# Patient Record
Sex: Male | Born: 1995 | Race: Black or African American | Hispanic: No | Marital: Single | Smoking: Never smoker
Health system: Southern US, Community
[De-identification: ages and names within clinical notes are randomized; demographics above are authoritative.]

---

## 2015-06-05 ENCOUNTER — Encounter: Payer: Self-pay | Admitting: Emergency Medicine

## 2015-06-05 ENCOUNTER — Emergency Department
Admission: EM | Admit: 2015-06-05 | Discharge: 2015-06-05 | Discharge status: Against Medical Advice | Payer: No Typology Code available for payment source | Attending: Emergency Medicine | Admitting: Emergency Medicine

## 2015-06-05 DIAGNOSIS — S3992XA Unspecified injury of lower back, initial encounter: Secondary | ICD-10-CM | POA: Diagnosis present

## 2015-06-05 DIAGNOSIS — Y9289 Other specified places as the place of occurrence of the external cause: Secondary | ICD-10-CM | POA: Diagnosis not present

## 2015-06-05 DIAGNOSIS — Y9389 Activity, other specified: Secondary | ICD-10-CM | POA: Insufficient documentation

## 2015-06-05 DIAGNOSIS — Y998 Other external cause status: Secondary | ICD-10-CM | POA: Insufficient documentation

## 2015-06-05 NOTE — ED Notes (Signed)
Not in room for eval . Gown in chair
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
## 2015-11-01 ENCOUNTER — Emergency Department: Payer: 59

## 2015-11-01 ENCOUNTER — Emergency Department
Admission: EM | Admit: 2015-11-01 | Discharge: 2015-11-01 | Disposition: A | Discharge status: Home / Self Care | Payer: 59 | Attending: Emergency Medicine | Admitting: Emergency Medicine

## 2015-11-01 DIAGNOSIS — S4992XA Unspecified injury of left shoulder and upper arm, initial encounter: Secondary | ICD-10-CM | POA: Insufficient documentation

## 2015-11-01 DIAGNOSIS — S80211A Abrasion, right knee, initial encounter: Secondary | ICD-10-CM | POA: Insufficient documentation

## 2015-11-01 DIAGNOSIS — Y998 Other external cause status: Secondary | ICD-10-CM | POA: Diagnosis not present

## 2015-11-01 DIAGNOSIS — S060X9A Concussion with loss of consciousness of unspecified duration, initial encounter: Secondary | ICD-10-CM | POA: Diagnosis not present

## 2015-11-01 DIAGNOSIS — Y9389 Activity, other specified: Secondary | ICD-10-CM | POA: Insufficient documentation

## 2015-11-01 DIAGNOSIS — Y92410 Unspecified street and highway as the place of occurrence of the external cause: Secondary | ICD-10-CM | POA: Diagnosis not present

## 2015-11-01 DIAGNOSIS — S4991XA Unspecified injury of right shoulder and upper arm, initial encounter: Secondary | ICD-10-CM | POA: Diagnosis not present

## 2015-11-01 DIAGNOSIS — S50311A Abrasion of right elbow, initial encounter: Secondary | ICD-10-CM | POA: Insufficient documentation

## 2015-11-01 DIAGNOSIS — T07XXXA Unspecified multiple injuries, initial encounter: Secondary | ICD-10-CM

## 2015-11-01 DIAGNOSIS — S80212A Abrasion, left knee, initial encounter: Secondary | ICD-10-CM | POA: Insufficient documentation

## 2015-11-01 DIAGNOSIS — S0990XA Unspecified injury of head, initial encounter: Secondary | ICD-10-CM | POA: Diagnosis present

## 2015-11-01 MED ORDER — BACITRACIN ZINC 500 UNIT/GM EX OINT
TOPICAL_OINTMENT | CUTANEOUS | Status: AC
  Administered 2015-11-01: 1
  Filled 2015-11-01: qty 0.9

## 2015-11-01 NOTE — ED Notes (Signed)
Pt called ride. Ride is on the way.

## 2015-11-01 NOTE — ED Notes (Signed)
Pt is sleepy. Pt denies any chest pain, abdominal pain, or SOB.
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
## 2015-11-01 NOTE — Discharge Instructions (Signed)
Concussion, Adult  A concussion, or closed-head injury, is a brain injury caused by a direct blow to the head or by a quick and sudden movement (jolt) of the head or neck. Concussions are usually not life-threatening. Even so, the effects of a concussion can be serious. If you have had a concussion before, you are more likely to experience concussion-like symptoms after a direct blow to the head.   CAUSES  · Direct blow to the head, such as from running into another player during a soccer game, being hit in a fight, or hitting your head on a hard surface.  · A jolt of the head or neck that causes the brain to move back and forth inside the skull, such as in a car crash.  SIGNS AND SYMPTOMS  The signs of a concussion can be hard to notice. Early on, they may be missed by you, family members, and health care providers. You may look fine but act or feel differently.  Symptoms are usually temporary, but they may last for days, weeks, or even longer. Some symptoms may appear right away while others may not show up for hours or days. Every head injury is different. Symptoms include:  · Mild to moderate headaches that will not go away.  · A feeling of pressure inside your head.  · Having more trouble than usual:    Learning or remembering things you have heard.    Answering questions.    Paying attention or concentrating.    Organizing daily tasks.    Making decisions and solving problems.  · Slowness in thinking, acting or reacting, speaking, or reading.  · Getting lost or being easily confused.  · Feeling tired all the time or lacking energy (fatigued).  · Feeling drowsy.  · Sleep disturbances.    Sleeping more than usual.    Sleeping less than usual.    Trouble falling asleep.    Trouble sleeping (insomnia).  · Loss of balance or feeling lightheaded or dizzy.  · Nausea or vomiting.  · Numbness or tingling.  · Increased sensitivity to:    Sounds.    Lights.    Distractions.  · Vision problems or eyes that tire  easily.  · Diminished sense of taste or smell.  · Ringing in the ears.  · Mood changes such as feeling sad or anxious.  · Becoming easily irritated or angry for little or no reason.  · Lack of motivation.  · Seeing or hearing things other people do not see or hear (hallucinations).  DIAGNOSIS  Your health care provider can usually diagnose a concussion based on a description of your injury and symptoms. He or she will ask whether you passed out (lost consciousness) and whether you are having trouble remembering events that happened right before and during your injury.  Your evaluation might include:  · A brain scan to look for signs of injury to the brain. Even if the test shows no injury, you may still have a concussion.  · Blood tests to be sure other problems are not present.  TREATMENT  · Concussions are usually treated in an emergency department, in urgent care, or at a clinic. You may need to stay in the hospital overnight for further treatment.  · Tell your health care provider if you are taking any medicines, including prescription medicines, over-the-counter medicines, and natural remedies. Some medicines, such as blood thinners (anticoagulants) and aspirin, may increase the chance of complications. Also tell your health care   provider whether you have had alcohol or are taking illegal drugs. This information may affect treatment.  · Your health care provider will send you home with important instructions to follow.  · How fast you will recover from a concussion depends on many factors. These factors include how severe your concussion is, what part of your brain was injured, your age, and how healthy you were before the concussion.  · Most people with mild injuries recover fully. Recovery can take time. In general, recovery is slower in older persons. Also, persons who have had a concussion in the past or have other medical problems may find that it takes longer to recover from their current injury.  HOME  CARE INSTRUCTIONS  General Instructions  · Carefully follow the directions your health care provider gave you.  · Only take over-the-counter or prescription medicines for pain, discomfort, or fever as directed by your health care provider.  · Take only those medicines that your health care provider has approved.  · Do not drink alcohol until your health care provider says you are well enough to do so. Alcohol and certain other drugs may slow your recovery and can put you at risk of further injury.  · If it is harder than usual to remember things, write them down.  · If you are easily distracted, try to do one thing at a time. For example, do not try to watch TV while fixing dinner.  · Talk with family members or close friends when making important decisions.  · Keep all follow-up appointments. Repeated evaluation of your symptoms is recommended for your recovery.  · Watch your symptoms and tell others to do the same. Complications sometimes occur after a concussion. Older adults with a brain injury may have a higher risk of serious complications, such as a blood clot on the brain.  · Tell your teachers, school nurse, school counselor, coach, athletic trainer, or work manager about your injury, symptoms, and restrictions. Tell them about what you can or cannot do. They should watch for:    Increased problems with attention or concentration.    Increased difficulty remembering or learning new information.    Increased time needed to complete tasks or assignments.    Increased irritability or decreased ability to cope with stress.    Increased symptoms.  · Rest. Rest helps the brain to heal. Make sure you:    Get plenty of sleep at night. Avoid staying up late at night.    Keep the same bedtime hours on weekends and weekdays.    Rest during the day. Take daytime naps or rest breaks when you feel tired.  · Limit activities that require a lot of thought or concentration. These include:    Doing homework or job-related  work.    Watching TV.    Working on the computer.  · Avoid any situation where there is potential for another head injury (football, hockey, soccer, basketball, martial arts, downhill snow sports and horseback riding). Your condition will get worse every time you experience a concussion. You should avoid these activities until you are evaluated by the appropriate follow-up health care providers.  Returning To Your Regular Activities  You will need to return to your normal activities slowly, not all at once. You must give your body and brain enough time for recovery.  · Do not return to sports or other athletic activities until your health care provider tells you it is safe to do so.  · Ask   your health care provider when you can drive, ride a bicycle, or operate heavy machinery. Your ability to react may be slower after a brain injury. Never do these activities if you are dizzy.  · Ask your health care provider about when you can return to work or school.  Preventing Another Concussion  It is very important to avoid another brain injury, especially before you have recovered. In rare cases, another injury can lead to permanent brain damage, brain swelling, or death. The risk of this is greatest during the first 7-10 days after a head injury. Avoid injuries by:  · Wearing a seat belt when riding in a car.  · Drinking alcohol only in moderation.  · Wearing a helmet when biking, skiing, skateboarding, skating, or doing similar activities.  · Avoiding activities that could lead to a second concussion, such as contact or recreational sports, until your health care provider says it is okay.  · Taking safety measures in your home.    Remove clutter and tripping hazards from floors and stairways.    Use grab bars in bathrooms and handrails by stairs.    Place non-slip mats on floors and in bathtubs.    Improve lighting in dim areas.  SEEK MEDICAL CARE IF:  · You have increased problems paying attention or  concentrating.  · You have increased difficulty remembering or learning new information.  · You need more time to complete tasks or assignments than before.  · You have increased irritability or decreased ability to cope with stress.  · You have more symptoms than before.  Seek medical care if you have any of the following symptoms for more than 2 weeks after your injury:  · Lasting (chronic) headaches.  · Dizziness or balance problems.  · Nausea.  · Vision problems.  · Increased sensitivity to noise or light.  · Depression or mood swings.  · Anxiety or irritability.  · Memory problems.  · Difficulty concentrating or paying attention.  · Sleep problems.  · Feeling tired all the time.  SEEK IMMEDIATE MEDICAL CARE IF:  · You have severe or worsening headaches. These may be a sign of a blood clot in the brain.  · You have weakness (even if only in one hand, leg, or part of the face).  · You have numbness.  · You have decreased coordination.  · You vomit repeatedly.  · You have increased sleepiness.  · One pupil is larger than the other.  · You have convulsions.  · You have slurred speech.  · You have increased confusion. This may be a sign of a blood clot in the brain.  · You have increased restlessness, agitation, or irritability.  · You are unable to recognize people or places.  · You have neck pain.  · It is difficult to wake you up.  · You have unusual behavior changes.  · You lose consciousness.  MAKE SURE YOU:  · Understand these instructions.  · Will watch your condition.  · Will get help right away if you are not doing well or get worse.     This information is not intended to replace advice given to you by your health care provider. Make sure you discuss any questions you have with your health care provider.     Document Released: 01/18/2004 Document Revised: 11/18/2014 Document Reviewed: 05/20/2013  Elsevier Interactive Patient Education ©2016 [employer]

## 2015-11-28 ENCOUNTER — Encounter: Payer: Self-pay | Admitting: Emergency Medicine

## 2015-11-28 ENCOUNTER — Emergency Department
Admission: EM | Admit: 2015-11-28 | Discharge: 2015-11-28 | Disposition: A | Discharge status: Home / Self Care | Payer: No Typology Code available for payment source | Attending: Emergency Medicine | Admitting: Emergency Medicine

## 2015-11-28 ENCOUNTER — Emergency Department: Payer: No Typology Code available for payment source

## 2015-11-28 DIAGNOSIS — Y998 Other external cause status: Secondary | ICD-10-CM | POA: Diagnosis not present

## 2015-11-28 DIAGNOSIS — M542 Cervicalgia: Secondary | ICD-10-CM | POA: Diagnosis not present

## 2015-11-28 DIAGNOSIS — Z743 Need for continuous supervision: Secondary | ICD-10-CM | POA: Diagnosis not present

## 2015-11-28 DIAGNOSIS — S299XXA Unspecified injury of thorax, initial encounter: Secondary | ICD-10-CM | POA: Diagnosis not present

## 2015-11-28 DIAGNOSIS — Y92410 Unspecified street and highway as the place of occurrence of the external cause: Secondary | ICD-10-CM | POA: Insufficient documentation

## 2015-11-28 DIAGNOSIS — S161XXA Strain of muscle, fascia and tendon at neck level, initial encounter: Secondary | ICD-10-CM | POA: Diagnosis not present

## 2015-11-28 DIAGNOSIS — S199XXA Unspecified injury of neck, initial encounter: Secondary | ICD-10-CM | POA: Diagnosis present

## 2015-11-28 DIAGNOSIS — Y9384 Activity, sleeping: Secondary | ICD-10-CM | POA: Insufficient documentation

## 2015-11-28 DIAGNOSIS — M545 Low back pain: Secondary | ICD-10-CM | POA: Diagnosis not present

## 2015-11-28 MED ORDER — IBUPROFEN 800 MG PO TABS: 800.0000 mg | ORAL_TABLET | Freq: Three times a day (TID) | ORAL | Status: DC | PRN

## 2015-11-28 MED ORDER — METHOCARBAMOL 750 MG PO TABS: 1500.0000 mg | ORAL_TABLET | Freq: Four times a day (QID) | ORAL | Status: DC

## 2015-11-28 MED ORDER — TRAMADOL HCL 50 MG PO TABS: 50.0000 mg | ORAL_TABLET | Freq: Four times a day (QID) | ORAL | Status: DC | PRN
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
## 2015-11-28 NOTE — Discharge Instructions (Signed)

## 2016-01-09 DIAGNOSIS — R05 Cough: Secondary | ICD-10-CM | POA: Diagnosis not present

## 2016-01-09 DIAGNOSIS — J101 Influenza due to other identified influenza virus with other respiratory manifestations: Secondary | ICD-10-CM | POA: Diagnosis not present

## 2016-08-11 DIAGNOSIS — N342 Other urethritis: Secondary | ICD-10-CM | POA: Diagnosis not present

## 2016-08-11 DIAGNOSIS — R300 Dysuria: Secondary | ICD-10-CM | POA: Diagnosis not present

## 2016-10-18 ENCOUNTER — Emergency Department (HOSPITAL_COMMUNITY)
Admission: EM | Admit: 2016-10-18 | Discharge: 2016-10-19 | Disposition: A | Discharge status: Home / Self Care | Payer: 59 | Attending: Emergency Medicine | Admitting: Emergency Medicine

## 2016-10-18 ENCOUNTER — Encounter (HOSPITAL_COMMUNITY): Payer: Self-pay | Admitting: Emergency Medicine

## 2016-10-18 ENCOUNTER — Emergency Department (HOSPITAL_COMMUNITY): Payer: 59

## 2016-10-18 DIAGNOSIS — S63642A Sprain of metacarpophalangeal joint of left thumb, initial encounter: Secondary | ICD-10-CM | POA: Insufficient documentation

## 2016-10-18 DIAGNOSIS — S199XXA Unspecified injury of neck, initial encounter: Secondary | ICD-10-CM | POA: Diagnosis not present

## 2016-10-18 DIAGNOSIS — M79645 Pain in left finger(s): Secondary | ICD-10-CM | POA: Diagnosis not present

## 2016-10-18 DIAGNOSIS — Y999 Unspecified external cause status: Secondary | ICD-10-CM | POA: Diagnosis not present

## 2016-10-18 DIAGNOSIS — Y92410 Unspecified street and highway as the place of occurrence of the external cause: Secondary | ICD-10-CM | POA: Diagnosis not present

## 2016-10-18 DIAGNOSIS — S6992XA Unspecified injury of left wrist, hand and finger(s), initial encounter: Secondary | ICD-10-CM | POA: Diagnosis not present

## 2016-10-18 DIAGNOSIS — T148XXA Other injury of unspecified body region, initial encounter: Secondary | ICD-10-CM | POA: Diagnosis not present

## 2016-10-18 DIAGNOSIS — M542 Cervicalgia: Secondary | ICD-10-CM | POA: Diagnosis not present

## 2016-10-18 DIAGNOSIS — S161XXA Strain of muscle, fascia and tendon at neck level, initial encounter: Secondary | ICD-10-CM | POA: Diagnosis not present

## 2016-10-18 DIAGNOSIS — Y9389 Activity, other specified: Secondary | ICD-10-CM | POA: Insufficient documentation
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
## 2016-10-19 DIAGNOSIS — S63642A Sprain of metacarpophalangeal joint of left thumb, initial encounter: Secondary | ICD-10-CM | POA: Diagnosis not present

## 2016-10-19 DIAGNOSIS — S161XXA Strain of muscle, fascia and tendon at neck level, initial encounter: Secondary | ICD-10-CM | POA: Diagnosis not present

## 2016-10-19 MED ORDER — IBUPROFEN 800 MG PO TABS: 800.0000 mg | ORAL_TABLET | Freq: Three times a day (TID) | ORAL | 0 refills | Status: DC

## 2016-10-19 MED ORDER — TRAMADOL HCL 50 MG PO TABS: 50.0000 mg | ORAL_TABLET | Freq: Four times a day (QID) | ORAL | 0 refills | Status: DC | PRN

## 2016-10-19 NOTE — Progress Notes (Signed)
Orthopedic Tech Progress Note Patient Details:  Mason Wright [birth year] [id number]  Ortho Devices Type of Ortho Device: Thumb velcro splint Ortho Device/Splint Location:  lue Ortho Device/Splint Interventions: Ordered, Application   Trinna PostMartinez, [name] 10/19/2016, 1:56 AM

## 2016-11-14 DIAGNOSIS — M25511 Pain in right shoulder: Secondary | ICD-10-CM | POA: Diagnosis not present

## 2016-11-14 DIAGNOSIS — S4991XA Unspecified injury of right shoulder and upper arm, initial encounter: Secondary | ICD-10-CM | POA: Diagnosis not present

## 2016-12-29 DIAGNOSIS — B081 Molluscum contagiosum: Secondary | ICD-10-CM | POA: Diagnosis not present

## 2017-03-14 DIAGNOSIS — Z114 Encounter for screening for human immunodeficiency virus [HIV]: Secondary | ICD-10-CM | POA: Diagnosis not present

## 2017-03-14 DIAGNOSIS — R21 Rash and other nonspecific skin eruption: Secondary | ICD-10-CM | POA: Diagnosis not present

## 2017-11-02 DIAGNOSIS — R05 Cough: Secondary | ICD-10-CM | POA: Diagnosis not present

## 2020-06-06 DIAGNOSIS — H938X2 Other specified disorders of left ear: Secondary | ICD-10-CM | POA: Diagnosis not present

## 2020-06-06 DIAGNOSIS — H6121 Impacted cerumen, right ear: Secondary | ICD-10-CM | POA: Diagnosis not present

## 2020-06-06 DIAGNOSIS — H6122 Impacted cerumen, left ear: Secondary | ICD-10-CM | POA: Diagnosis not present

## 2021-02-14 ENCOUNTER — Emergency Department (HOSPITAL_COMMUNITY)
Admission: EM | Admit: 2021-02-14 | Discharge: 2021-02-14 | Disposition: A | Discharge status: Home / Self Care | Payer: 59 | Attending: Emergency Medicine | Admitting: Emergency Medicine

## 2021-02-14 ENCOUNTER — Other Ambulatory Visit: Payer: Self-pay

## 2021-02-14 ENCOUNTER — Encounter (HOSPITAL_COMMUNITY): Payer: Self-pay

## 2021-02-14 DIAGNOSIS — R197 Diarrhea, unspecified: Secondary | ICD-10-CM | POA: Diagnosis not present

## 2021-02-14 DIAGNOSIS — R112 Nausea with vomiting, unspecified: Secondary | ICD-10-CM | POA: Insufficient documentation

## 2021-02-14 DIAGNOSIS — R109 Unspecified abdominal pain: Secondary | ICD-10-CM | POA: Diagnosis not present

## 2021-02-14 LAB — CBC WITH DIFFERENTIAL/PLATELET
Abs Immature Granulocytes: 0.09 10*3/uL — ABNORMAL HIGH (ref 0.00–0.07)
Basophils Absolute: 0 10*3/uL (ref 0.0–0.1)
Basophils Relative: 0 %
Eosinophils Absolute: 0 10*3/uL (ref 0.0–0.5)
Eosinophils Relative: 0 %
HCT: 53.3 % — ABNORMAL HIGH (ref 39.0–52.0)
Hemoglobin: 17.1 g/dL — ABNORMAL HIGH (ref 13.0–17.0)
Immature Granulocytes: 1 %
Lymphocytes Relative: 3 %
Lymphs Abs: 0.3 10*3/uL — ABNORMAL LOW (ref 0.7–4.0)
MCH: 26.8 pg (ref 26.0–34.0)
MCHC: 32.1 g/dL (ref 30.0–36.0)
MCV: 83.7 fL (ref 80.0–100.0)
Monocytes Absolute: 0.6 10*3/uL (ref 0.1–1.0)
Monocytes Relative: 5 %
Neutro Abs: 11 10*3/uL — ABNORMAL HIGH (ref 1.7–7.7)
Neutrophils Relative %: 91 %
Platelets: 250 10*3/uL (ref 150–400)
RBC: 6.37 MIL/uL — ABNORMAL HIGH (ref 4.22–5.81)
RDW: 12.9 % (ref 11.5–15.5)
WBC: 12.1 10*3/uL — ABNORMAL HIGH (ref 4.0–10.5)
nRBC: 0 % (ref 0.0–0.2)

## 2021-02-14 LAB — COMPREHENSIVE METABOLIC PANEL
ALT: 16 U/L (ref 0–44)
AST: 28 U/L (ref 15–41)
Albumin: 3.9 g/dL (ref 3.5–5.0)
Alkaline Phosphatase: 43 U/L (ref 38–126)
Anion gap: 8 (ref 5–15)
BUN: 18 mg/dL (ref 6–20)
CO2: 28 mmol/L (ref 22–32)
Calcium: 8.6 mg/dL — ABNORMAL LOW (ref 8.9–10.3)
Chloride: 103 mmol/L (ref 98–111)
Creatinine, Ser: 1.34 mg/dL — ABNORMAL HIGH (ref 0.61–1.24)
GFR, Estimated: >60 mL/min (ref >60)
Glucose, Bld: 132 mg/dL — ABNORMAL HIGH (ref 70–99)
Potassium: 4.8 mmol/L (ref 3.5–5.1)
Sodium: 139 mmol/L (ref 135–145)
Total Bilirubin: 1.2 mg/dL (ref 0.3–1.2)
Total Protein: 6.8 g/dL (ref 6.5–8.1)

## 2021-02-14 LAB — LIPASE, BLOOD: Lipase: 24 U/L (ref 11–51)

## 2021-02-14 MED ORDER — DICYCLOMINE HCL 20 MG PO TABS: 20.0000 mg | ORAL_TABLET | Freq: Two times a day (BID) | ORAL | 0 refills | Status: DC

## 2021-02-14 MED ORDER — DICYCLOMINE HCL 10 MG/ML IM SOLN
20.0000 mg | Freq: Once | INTRAMUSCULAR | Status: AC
  Administered 2021-02-14: 20 mg via INTRAMUSCULAR
  Filled 2021-02-14: qty 2

## 2021-02-14 MED ORDER — ONDANSETRON 4 MG PO TBDP: 4.0000 mg | ORAL_TABLET | Freq: Three times a day (TID) | ORAL | 0 refills | Status: DC | PRN

## 2021-02-14 MED ORDER — ONDANSETRON HCL 4 MG/2ML IJ SOLN
4.0000 mg | Freq: Once | INTRAMUSCULAR | Status: AC
  Administered 2021-02-14: 4 mg via INTRAVENOUS
  Filled 2021-02-14: qty 2

## 2021-02-14 MED ORDER — SODIUM CHLORIDE 0.9 % IV BOLUS
1000.0000 mL | Freq: Once | INTRAVENOUS | Status: AC
  Administered 2021-02-14: 1000 mL via INTRAVENOUS

## 2021-02-14 NOTE — Discharge Instructions (Signed)
Take medications as needed to help with your symptoms. Make sure you are drinking plenty of fluids and slowly advancing your diet as you start to feel better. Follow-up with your primary care provider. Return to the ER if you start to experience worsening abdominal pain, fever, chest pain or persistent vomiting.

## 2021-02-14 NOTE — ED Triage Notes (Signed)
Pt arrived via walk in, c/o vomiting/diarrhea. States he believes he has food poisoning. Started vomiting after eating last night, now seeing blood in emesis.

## 2021-02-14 NOTE — ED Provider Notes (Signed)
[facility]-EMERGENCY DEPT Provider Note   CSN: [id number] Arrival date & time: 02/14/21  1018     History Chief Complaint  Patient presents with  . Vomiting  . Diarrhea    Mason Wright is a 25 y.o. male who presents to ED with a chief complaint of vomiting, diarrhea and abdominal cramping.  States that symptoms to have been going on for the past 10 hours.  He believes he might have food poisoning from drinking a bad milkshake.  He has tried Pepto-Bismol without much improvement in his symptoms.  Reports abdominal cramping that is only present with having a bowel movement or vomiting.  Reports some streaks of bright red blood with vomiting as well.  He denies any prior abdominal surgeries, urinary symptoms, fever or sick contacts with similar symptoms.  No urinary symptoms.  HPI     History reviewed. No pertinent past medical history.  There are no problems to display for this patient.   History reviewed. No pertinent surgical history.     History reviewed. No pertinent family history.  Social History   Tobacco Use  . Smoking status: Never Smoker  . Smokeless tobacco: Never Used  Substance Use Topics  . Alcohol use: No  . Drug use: No    Home Medications Prior to Admission medications   Medication Sig Start Date End Date Taking? Authorizing Provider  dicyclomine (BENTYL) 20 MG tablet Take 1 tablet (20 mg total) by mouth 2 (two) times daily. 02/14/21  Yes Neilson Oehlert, PA-C  ondansetron (ZOFRAN ODT) 4 MG disintegrating tablet Take 1 tablet (4 mg total) by mouth every 8 (eight) hours as needed for nausea or vomiting. 02/14/21  Yes Atom Solivan, PA-C  ibuprofen (ADVIL,MOTRIN) 800 MG tablet Take 1 tablet (800 mg total) by mouth 3 (three) times daily. 10/19/16   Gilda Crease, MD  traMADol (ULTRAM) 50 MG tablet Take 1 tablet (50 mg total) by mouth every 6 (six) hours as needed. 10/19/16   Gilda Crease, MD    Allergies    Patient has  no known allergies.  Review of Systems   Review of Systems  Constitutional: Negative for appetite change, chills and fever.  HENT: Negative for ear pain, rhinorrhea, sneezing and sore throat.   Eyes: Negative for photophobia and visual disturbance.  Respiratory: Negative for cough, chest tightness, shortness of breath and wheezing.   Cardiovascular: Negative for chest pain and palpitations.  Gastrointestinal: Positive for abdominal pain (Cramping), diarrhea, nausea and vomiting. Negative for blood in stool and constipation.  Genitourinary: Negative for dysuria, hematuria and urgency.  Musculoskeletal: Negative for myalgias.  Skin: Negative for rash.  Neurological: Negative for dizziness, weakness and light-headedness.    Physical Exam Updated Vital Signs BP 130/70   Pulse 80   Temp 99.2 F (37.3 C) (Oral)   Resp 16   SpO2 100%   Physical Exam Vitals and nursing note reviewed.  Constitutional:      General: He is not in acute distress.    Appearance: He is well-developed.  HENT:     Head: Normocephalic and atraumatic.     Nose: Nose normal.  Eyes:     General: No scleral icterus.       Right eye: No discharge.        Left eye: No discharge.     Conjunctiva/sclera: Conjunctivae normal.  Cardiovascular:     Rate and Rhythm: Normal rate and regular rhythm.     Heart sounds: Normal heart  sounds. No murmur heard. No friction rub. No gallop.   Pulmonary:     Effort: Pulmonary effort is normal. No respiratory distress.     Breath sounds: Normal breath sounds.  Abdominal:     General: Bowel sounds are normal. There is no distension.     Palpations: Abdomen is soft.     Tenderness: There is no abdominal tenderness. There is no guarding.     Comments: Soft, nontender nondistended.  Musculoskeletal:        General: Normal range of motion.     Cervical back: Normal range of motion and neck supple.  Skin:    General: Skin is warm and dry.     Findings: No rash.   Neurological:     Mental Status: He is alert.     Motor: No abnormal muscle tone.     Coordination: Coordination normal.     ED Results / Procedures / Treatments   Labs (all labs ordered are listed, but only abnormal results are displayed) Labs Reviewed  COMPREHENSIVE METABOLIC PANEL - Abnormal; Notable for the following components:      Result Value   Glucose, Bld 132 (*)    Creatinine, Ser 1.34 (*)    Calcium 8.6 (*)    All other components within normal limits  CBC WITH DIFFERENTIAL/PLATELET - Abnormal; Notable for the following components:   WBC 12.1 (*)    RBC 6.37 (*)    Hemoglobin 17.1 (*)    HCT 53.3 (*)    Neutro Abs 11.0 (*)    Lymphs Abs 0.3 (*)    Abs Immature Granulocytes 0.09 (*)    All other components within normal limits  LIPASE, BLOOD    EKG None  Radiology No results found.  Procedures Procedures   Medications Ordered in ED Medications  sodium chloride 0.9 % bolus 1,000 mL (1,000 mLs Intravenous New Bag/Given 02/14/21 1130)  ondansetron (ZOFRAN) injection 4 mg (4 mg Intravenous Given 02/14/21 1131)  dicyclomine (BENTYL) injection 20 mg (20 mg Intramuscular Given 02/14/21 1124)    ED Course  I have reviewed the triage vital signs and the nursing notes.  Pertinent labs & imaging results that were available during my care of the patient were reviewed by me and considered in my medical decision making (see chart for details).    MDM Rules/Calculators/A&P                          25 year old male presenting to the ED with a chief complaint of vomiting, diarrhea and abdominal cramping.  Symptoms have been going on for the past 10 hours.  Reports that he tried Pepto-Bismol without much improvement in his symptoms.  He is unsure if this is related to food poisoning from a bad milkshake that he drank last night.  Denies any abdominal pain other than when he has a bowel movement or vomiting.  Does report some streaks of bright red blood with vomiting.  No  prior abdominal surgeries, fever or urinary symptoms.  On exam patient abdomen is soft, nontender nondistended.  He is in no acute distress.  Unable to visualize any blood in his vomitus.  Although I believe this is most likely due to his constant retching.  He has no gross hematemesis here.  Work-up significant for leukocytosis of 12.1, hemoconcentration.  CMP unremarkable.  Lipase is unremarkable.  Patient given IV fluids, Zofran and Bentyl here with complete resolution of his symptoms.  Repeat abdominal  exams remain benign.  I feel his leukocytosis is most likely reactive from his symptoms today.  I doubt appendicitis, cholecystitis or other surgical emergent cause of his symptoms.  Suspect that his symptoms are viral in nature.  We will have him increase hydration, slowly advance his diet and symptomatic treatment at home.  Patient is agreeable to the plan.  Return precautions given.   Patient is hemodynamically stable, in NAD, and able to ambulate in the ED. Evaluation does not show pathology that would require ongoing emergent intervention or inpatient treatment. I explained the diagnosis to the patient. Pain has been managed and has no complaints prior to discharge. Patient is comfortable with above plan and is stable for discharge at this time. All questions were answered prior to disposition. Strict return precautions for returning to the ED were discussed. Encouraged follow up with PCP.   An After Visit Summary was printed and given to the patient.   Portions of this note were generated with Scientist, clinical (histocompatibility and immunogenetics). Dictation errors may occur despite best attempts at proofreading.  Final Clinical Impression(s) / ED Diagnoses Final diagnoses:  Nausea vomiting and diarrhea    Rx / DC Orders ED Discharge Orders         Ordered    ondansetron (ZOFRAN ODT) 4 MG disintegrating tablet  Every 8 hours PRN        02/14/21 1238    dicyclomine (BENTYL) 20 MG tablet  2 times daily        02/14/21  [address] PA-C 02/14/21 1244    Horton, Clabe Seal, DO 02/15/21 1016

## 2022-01-01 ENCOUNTER — Emergency Department
Admission: EM | Admit: 2022-01-01 | Discharge: 2022-01-01 | Disposition: A | Discharge status: Home / Self Care | Payer: No Typology Code available for payment source | Attending: Emergency Medicine | Admitting: Emergency Medicine

## 2022-01-01 ENCOUNTER — Emergency Department: Payer: No Typology Code available for payment source

## 2022-01-01 ENCOUNTER — Other Ambulatory Visit: Payer: Self-pay

## 2022-01-01 DIAGNOSIS — R29818 Other symptoms and signs involving the nervous system: Secondary | ICD-10-CM | POA: Diagnosis not present

## 2022-01-01 DIAGNOSIS — M542 Cervicalgia: Secondary | ICD-10-CM | POA: Insufficient documentation

## 2022-01-01 DIAGNOSIS — Y92410 Unspecified street and highway as the place of occurrence of the external cause: Secondary | ICD-10-CM | POA: Diagnosis not present

## 2022-01-01 DIAGNOSIS — M546 Pain in thoracic spine: Secondary | ICD-10-CM | POA: Insufficient documentation

## 2022-01-01 MED ORDER — CYCLOBENZAPRINE HCL 5 MG PO TABS: 5.0000 mg | ORAL_TABLET | Freq: Three times a day (TID) | ORAL | 0 refills | Status: DC | PRN

## 2022-01-01 MED ORDER — ACETAMINOPHEN 500 MG PO TABS
1000.0000 mg | ORAL_TABLET | Freq: Once | ORAL | Status: AC
  Administered 2022-01-01: 1000 mg via ORAL
  Filled 2022-01-01: qty 2
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
## 2022-01-01 NOTE — Discharge Instructions (Addendum)
-  Take Tylenol/ibuprofen as needed for pain.  Use cyclobenzaprine sparingly -Return to the emergency department anytime if you begin to experience any new or worsening symptoms.
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
## 2022-01-07 ENCOUNTER — Ambulatory Visit: Payer: Self-pay | Attending: Internal Medicine | Admitting: Internal Medicine

## 2022-01-07 ENCOUNTER — Other Ambulatory Visit: Payer: Self-pay

## 2022-01-07 DIAGNOSIS — S134XXA Sprain of ligaments of cervical spine, initial encounter: Secondary | ICD-10-CM

## 2022-01-07 DIAGNOSIS — Z7689 Persons encountering health services in other specified circumstances: Secondary | ICD-10-CM

## 2022-01-07 MED ORDER — CYCLOBENZAPRINE HCL 10 MG PO TABS: 10.0000 mg | ORAL_TABLET | Freq: Two times a day (BID) | ORAL | 0 refills | Status: AC | PRN
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
## 2022-01-16 ENCOUNTER — Other Ambulatory Visit: Payer: Self-pay

## 2022-01-16 ENCOUNTER — Ambulatory Visit: Payer: No Typology Code available for payment source | Attending: Internal Medicine

## 2022-01-16 DIAGNOSIS — M6281 Muscle weakness (generalized): Secondary | ICD-10-CM

## 2022-01-16 DIAGNOSIS — S134XXA Sprain of ligaments of cervical spine, initial encounter: Secondary | ICD-10-CM | POA: Diagnosis not present

## 2022-01-16 DIAGNOSIS — M542 Cervicalgia: Secondary | ICD-10-CM | POA: Diagnosis present
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
## 2022-01-23 ENCOUNTER — Ambulatory Visit: Payer: No Typology Code available for payment source

## 2022-01-23 ENCOUNTER — Other Ambulatory Visit: Payer: Self-pay

## 2022-01-23 DIAGNOSIS — M6281 Muscle weakness (generalized): Secondary | ICD-10-CM

## 2022-01-23 DIAGNOSIS — M542 Cervicalgia: Secondary | ICD-10-CM
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
## 2022-01-25 ENCOUNTER — Other Ambulatory Visit: Payer: Self-pay

## 2022-01-25 ENCOUNTER — Ambulatory Visit: Payer: No Typology Code available for payment source

## 2022-01-25 DIAGNOSIS — M6281 Muscle weakness (generalized): Secondary | ICD-10-CM

## 2022-01-25 DIAGNOSIS — M542 Cervicalgia: Secondary | ICD-10-CM

## 2022-01-28 ENCOUNTER — Ambulatory Visit: Payer: No Typology Code available for payment source

## 2022-01-28 ENCOUNTER — Other Ambulatory Visit: Payer: Self-pay

## 2022-01-28 DIAGNOSIS — M542 Cervicalgia: Secondary | ICD-10-CM

## 2022-01-28 DIAGNOSIS — M6281 Muscle weakness (generalized): Secondary | ICD-10-CM
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
## 2022-01-30 ENCOUNTER — Other Ambulatory Visit: Payer: Self-pay

## 2022-01-30 ENCOUNTER — Ambulatory Visit: Payer: No Typology Code available for payment source

## 2022-01-30 DIAGNOSIS — M542 Cervicalgia: Secondary | ICD-10-CM

## 2022-01-30 DIAGNOSIS — M6281 Muscle weakness (generalized): Secondary | ICD-10-CM
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
## 2022-02-06 ENCOUNTER — Ambulatory Visit: Payer: No Typology Code available for payment source

## 2022-02-06 ENCOUNTER — Telehealth: Payer: Self-pay
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
## 2022-02-06 NOTE — Telephone Encounter (Signed)
Attempted to call patient regarding second missed visit via no-show. Voicemail is not set up and therefore was unable to leave a message. ? ?Should he call back to schedule additional appointments, only will schedule one at a time. ? ?Mason Wright, PT ?02/06/22 10:35 AM ? ?

## 2023-01-05 ENCOUNTER — Encounter (HOSPITAL_COMMUNITY): Payer: Self-pay

## 2023-01-05 ENCOUNTER — Other Ambulatory Visit: Payer: Self-pay

## 2023-01-05 ENCOUNTER — Emergency Department (HOSPITAL_COMMUNITY)
Admission: EM | Admit: 2023-01-05 | Discharge: 2023-01-05 | Disposition: A | Discharge status: Home / Self Care | Payer: No Typology Code available for payment source | Attending: Emergency Medicine | Admitting: Emergency Medicine

## 2023-01-05 DIAGNOSIS — R519 Headache, unspecified: Secondary | ICD-10-CM | POA: Diagnosis not present

## 2023-01-05 DIAGNOSIS — M62830 Muscle spasm of back: Secondary | ICD-10-CM | POA: Diagnosis not present

## 2023-01-05 DIAGNOSIS — Y92410 Unspecified street and highway as the place of occurrence of the external cause: Secondary | ICD-10-CM | POA: Insufficient documentation

## 2023-01-05 DIAGNOSIS — M546 Pain in thoracic spine: Secondary | ICD-10-CM | POA: Diagnosis present

## 2023-01-05 MED ORDER — CYCLOBENZAPRINE HCL 10 MG PO TABS: 10.0000 mg | ORAL_TABLET | Freq: Two times a day (BID) | ORAL | 0 refills | Status: AC | PRN

## 2023-01-05 MED ORDER — LIDOCAINE 5 % EX PTCH: 1.0000 | MEDICATED_PATCH | CUTANEOUS | 0 refills | Status: AC
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
## 2023-01-05 NOTE — ED Notes (Signed)
Pt d/c home per MD order. Discharge summary reviewed with pt, pt verbalizes understanding. No s/s of acute distress noted at discharge.,
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
## 2023-06-12 ENCOUNTER — Emergency Department (HOSPITAL_COMMUNITY)
Admission: EM | Admit: 2023-06-12 | Discharge: 2023-06-12 | Disposition: A | Discharge status: Home / Self Care | Payer: Medicaid Other | Attending: Emergency Medicine | Admitting: Emergency Medicine

## 2023-06-12 DIAGNOSIS — K029 Dental caries, unspecified: Secondary | ICD-10-CM | POA: Insufficient documentation

## 2023-06-12 MED ORDER — PENICILLIN V POTASSIUM 500 MG PO TABS: 500.0000 mg | ORAL_TABLET | Freq: Four times a day (QID) | ORAL | 0 refills | Status: AC

## 2023-06-12 MED ORDER — IBUPROFEN 800 MG PO TABS: 800.0000 mg | ORAL_TABLET | Freq: Three times a day (TID) | ORAL | 1 refills | Status: DC | PRN

## 2023-06-12 NOTE — ED Provider Notes (Signed)
[place] EMERGENCY DEPARTMENT AT [facility] Provider Note   CSN: [id number] Arrival date & time: 06/12/23  1800     History  No chief complaint on file.   Mason Wright is a 27 y.o. male.  Patient states that he recently started with a tooth ache to his right upper teeth.  But he has had a tooth ache to his left lower molar area for a few weeks  The history is provided by the patient and medical records. No language interpreter was used.  Dental Pain Location:  Upper and lower Quality:  Constant Severity:  Moderate Onset quality:  Sudden Timing:  Constant Progression:  Worsening Chronicity:  New Context: abscess   Relieved by:  Nothing Associated symptoms: no congestion and no headaches        Home Medications Prior to Admission medications   Medication Sig Start Date End Date Taking? Authorizing Provider  ibuprofen (ADVIL) 800 MG tablet Take 1 tablet (800 mg total) by mouth every 8 (eight) hours as needed for moderate pain. 06/12/23  Yes Bethann Berkshire, MD  penicillin v potassium (VEETID) 500 MG tablet Take 1 tablet (500 mg total) by mouth 4 (four) times daily for 7 days. 06/12/23 06/19/23 Yes Bethann Berkshire, MD  cyclobenzaprine (FLEXERIL) 10 MG tablet Take 1 tablet (10 mg total) by mouth 2 (two) times daily as needed for muscle spasms. 01/05/23   Tegeler, Canary Brim, MD  lidocaine (LIDODERM) 5 % Place 1 patch onto the skin daily. Remove & Discard patch within 12 hours or as directed by MD 01/05/23   Tegeler, Canary Brim, MD      Allergies    Patient has no known allergies.    Review of Systems   Review of Systems  Constitutional:  Negative for appetite change and fatigue.  HENT:  Negative for congestion, ear discharge and sinus pressure.        Tooth ache  Eyes:  Negative for discharge.  Respiratory:  Negative for cough.   Cardiovascular:  Negative for chest pain.  Gastrointestinal:  Negative for abdominal pain and diarrhea.  Genitourinary:   Negative for frequency and hematuria.  Musculoskeletal:  Negative for back pain.  Skin:  Negative for rash.  Neurological:  Negative for seizures and headaches.  Psychiatric/Behavioral:  Negative for hallucinations.     Physical Exam Updated Vital Signs BP 129/78 (BP Location: Right Arm)   Pulse 74   Temp 98 F (36.7 C) (Oral)   Resp 18   SpO2 99%  Physical Exam Vitals and nursing note reviewed.  Constitutional:      Appearance: He is well-developed.  HENT:     Head: Normocephalic.     Nose: Nose normal.     Mouth/Throat:     Comments: Patient has tenderness to right upper tooth and left lower tooth Eyes:     General: No scleral icterus.    Conjunctiva/sclera: Conjunctivae normal.  Neck:     Thyroid: No thyromegaly.  Cardiovascular:     Rate and Rhythm: Normal rate and regular rhythm.     Heart sounds: No murmur heard.    No friction rub. No gallop.  Pulmonary:     Breath sounds: No stridor. No wheezing or rales.  Chest:     Chest wall: No tenderness.  Abdominal:     General: There is no distension.     Tenderness: There is no abdominal tenderness. There is no rebound.  Musculoskeletal:        General:  Normal range of motion.     Cervical back: Neck supple.  Lymphadenopathy:     Cervical: No cervical adenopathy.  Skin:    Findings: No erythema or rash.  Neurological:     Mental Status: He is alert and oriented to person, place, and time.     Motor: No abnormal muscle tone.     Coordination: Coordination normal.  Psychiatric:        Behavior: Behavior normal.     ED Results / Procedures / Treatments   Labs (all labs ordered are listed, but only abnormal results are displayed) Labs Reviewed - No data to display  EKG None  Radiology No results found.  Procedures Procedures    Medications Ordered in ED Medications - No data to display  ED Course/ Medical Decision Making/ A&P                                 Medical Decision  Making Risk Prescription drug management.   Patient with dental caries.  He is put on penicillin and 800 mg ibuprofen and will follow-up with a dentist        Final Clinical Impression(s) / ED Diagnoses Final diagnoses:  Pain due to dental caries    Rx / DC Orders ED Discharge Orders          Ordered    penicillin v potassium (VEETID) 500 MG tablet  4 times daily        06/12/23 1829    ibuprofen (ADVIL) 800 MG tablet  Every 8 hours PRN        06/12/23 Elna Breslow, MD 06/14/23 1257

## 2023-06-12 NOTE — Discharge Instructions (Signed)
Follow-up with the dentist in the next week.  You have been referred to Dr. Leanord Asal.  Call his office and tell them you were seen at [facility] emergency department and referred to their office

## 2023-06-30 ENCOUNTER — Emergency Department
Admission: EM | Admit: 2023-06-30 | Discharge: 2023-06-30 | Disposition: A | Discharge status: Home / Self Care | Payer: Medicaid Other | Source: Home / Self Care | Attending: Emergency Medicine | Admitting: Emergency Medicine

## 2023-06-30 ENCOUNTER — Other Ambulatory Visit: Payer: Self-pay

## 2023-06-30 DIAGNOSIS — K529 Noninfective gastroenteritis and colitis, unspecified: Secondary | ICD-10-CM

## 2023-06-30 LAB — CBC
HCT: 45 % (ref 39.0–52.0)
Hemoglobin: 14 g/dL (ref 13.0–17.0)
MCH: 26 pg (ref 26.0–34.0)
MCHC: 31.1 g/dL (ref 30.0–36.0)
MCV: 83.6 fL (ref 80.0–100.0)
Platelets: 275 10*3/uL (ref 150–400)
RBC: 5.38 MIL/uL (ref 4.22–5.81)
RDW: 13.3 % (ref 11.5–15.5)
WBC: 4.4 10*3/uL (ref 4.0–10.5)
nRBC: 0 % (ref 0.0–0.2)

## 2023-06-30 LAB — URINALYSIS, ROUTINE W REFLEX MICROSCOPIC
Bilirubin Urine: NEGATIVE
Glucose, UA: NEGATIVE mg/dL
Hgb urine dipstick: NEGATIVE
Ketones, ur: NEGATIVE mg/dL
Leukocytes,Ua: NEGATIVE
Nitrite: NEGATIVE
Protein, ur: NEGATIVE mg/dL
Specific Gravity, Urine: 1.024 (ref 1.005–1.030)
pH: 5 (ref 5.0–8.0)

## 2023-06-30 LAB — COMPREHENSIVE METABOLIC PANEL
ALT: 17 U/L (ref 0–44)
AST: 22 U/L (ref 15–41)
Albumin: 3.3 g/dL — ABNORMAL LOW (ref 3.5–5.0)
Alkaline Phosphatase: 52 U/L (ref 38–126)
Anion gap: 6 (ref 5–15)
BUN: 13 mg/dL (ref 6–20)
CO2: 26 mmol/L (ref 22–32)
Calcium: 8.5 mg/dL — ABNORMAL LOW (ref 8.9–10.3)
Chloride: 106 mmol/L (ref 98–111)
Creatinine, Ser: 1.39 mg/dL — ABNORMAL HIGH (ref 0.61–1.24)
GFR, Estimated: >60 mL/min (ref >60)
Glucose, Bld: 91 mg/dL (ref 70–99)
Potassium: 4.2 mmol/L (ref 3.5–5.1)
Sodium: 138 mmol/L (ref 135–145)
Total Bilirubin: 0.4 mg/dL (ref 0.3–1.2)
Total Protein: 6.1 g/dL — ABNORMAL LOW (ref 6.5–8.1)

## 2023-06-30 LAB — LIPASE, BLOOD: Lipase: 37 U/L (ref 11–51)

## 2023-06-30 MED ORDER — AMOXICILLIN-POT CLAVULANATE 875-125 MG PO TABS: 1.0000 | ORAL_TABLET | Freq: Two times a day (BID) | ORAL | 0 refills | Status: AC

## 2023-06-30 NOTE — ED Provider Notes (Signed)
[facility] Provider Note    Event Date/Time   First MD Initiated Contact with Patient 06/30/23 1350     (approximate)   History   Abdominal Pain   HPI  Mason Wright is a 27 y.o. male abdominal cramping relieved by diarrhea which has been ongoing for about a week now.  Denies nausea or vomiting.  Nonbloody stool     Physical Exam   Triage Vital Signs: ED Triage Vitals  Encounter Vitals Group     BP 06/30/23 1233 115/67     Systolic BP Percentile --      Diastolic BP Percentile --      Pulse Rate 06/30/23 1233 76     Resp 06/30/23 1233 15     Temp 06/30/23 1233 98.3 F (36.8 C)     Temp Source 06/30/23 1233 Oral     SpO2 06/30/23 1233 96 %     Weight 06/30/23 1235 88 kg (194 lb)     Height 06/30/23 1235 1.753 m (5\' 9" )     Head Circumference --      Peak Flow --      Pain Score 06/30/23 1233 8     Pain Loc --      Pain Education --      Exclude from Growth Chart --     Most recent vital signs: Vitals:   06/30/23 1233  BP: 115/67  Pulse: 76  Resp: 15  Temp: 98.3 F (36.8 C)  SpO2: 96%     General: Awake, no distress.  CV:  Good peripheral perfusion.  Resp:  Normal effort.  Abd:  No distention.  Soft, nontender  other:     ED Results / Procedures / Treatments   Labs (all labs ordered are listed, but only abnormal results are displayed) Labs Reviewed  COMPREHENSIVE METABOLIC PANEL - Abnormal; Notable for the following components:      Result Value   Creatinine, Ser 1.39 (*)    Calcium 8.5 (*)    Total Protein 6.1 (*)    Albumin 3.3 (*)    All other components within normal limits  URINALYSIS, ROUTINE W REFLEX MICROSCOPIC - Abnormal; Notable for the following components:   Color, Urine YELLOW (*)    APPearance CLEAR (*)    All other components within normal limits  LIPASE, BLOOD  CBC     EKG     RADIOLOGY     PROCEDURES:  Critical Care performed:   Procedures   MEDICATIONS ORDERED IN  ED: Medications - No data to display   IMPRESSION / MDM / ASSESSMENT AND PLAN / ED COURSE  I reviewed the triage vital signs and the nursing notes. Patient's presentation is most consistent with acute illness / injury with system symptoms.  Patient presents with diarrhea, abdominal cramping as detailed above.  Symptoms most likely consistent with enteritis versus colitis, either viral versus bacterial.  Given symptoms been ongoing for about a week now will start the patient on antibiotics, overall well-appearing appropriate for outpatient follow-up, no indication for admission at this time.        FINAL CLINICAL IMPRESSION(S) / ED DIAGNOSES   Final diagnoses:  Colitis     Rx / DC Orders   ED Discharge Orders          Ordered    amoxicillin-clavulanate (AUGMENTIN) 875-125 MG tablet  2 times daily        06/30/23 1407  Note:  This document was prepared using Dragon voice recognition software and may include unintentional dictation errors.   Jene Every, MD 06/30/23 1501

## 2023-06-30 NOTE — ED Triage Notes (Signed)
Pt c/o intermittent lower abdominal pain with diarrhea x1 week. Pt denies fevers. Pt reports anytime he eats, he has diarrhea. Pt reports the abdominal pain comes just before having diarrhea and then subsides after he uses the bathroom. Pt denies n/v. Pt's significant other has the same symptoms.

## 2023-06-30 NOTE — ED Notes (Signed)
Pt A&O x4, no obvious distress noted, respirations regular/unlabored. Pt verbalizes understanding of discharge instructions. Pt able to ambulate from ED independently.   

## 2023-08-30 ENCOUNTER — Emergency Department (HOSPITAL_COMMUNITY)
Admission: EM | Admit: 2023-08-30 | Discharge: 2023-08-30 | Disposition: A | Discharge status: Home / Self Care | Payer: No Typology Code available for payment source | Attending: Emergency Medicine | Admitting: Emergency Medicine

## 2023-08-30 ENCOUNTER — Encounter (HOSPITAL_COMMUNITY): Payer: Self-pay | Admitting: Emergency Medicine

## 2023-08-30 DIAGNOSIS — K047 Periapical abscess without sinus: Secondary | ICD-10-CM | POA: Diagnosis not present

## 2023-08-30 DIAGNOSIS — R509 Fever, unspecified: Secondary | ICD-10-CM | POA: Diagnosis present

## 2023-08-30 MED ORDER — AMOXICILLIN-POT CLAVULANATE 875-125 MG PO TABS: 1.0000 | ORAL_TABLET | Freq: Two times a day (BID) | ORAL | 0 refills | Status: AC

## 2023-08-30 MED ORDER — IBUPROFEN 800 MG PO TABS: 800.0000 mg | ORAL_TABLET | Freq: Three times a day (TID) | ORAL | 0 refills | Status: AC | PRN

## 2023-08-30 NOTE — ED Provider Notes (Signed)
[place] EMERGENCY DEPARTMENT AT [facility] Provider Note   CSN: [id number] Arrival date & time: 08/30/23  1546     History  Chief Complaint  Patient presents with   Dental Pain    Mason Wright is a 27 y.o. male, who presents to the ED 2/2 to concerns of fever, mild facial swelling and R upper tooth painx1 week. Reports highest temp of 101.65F. Denies any confusion. Does not currently have a dentist. No facial trauma.     Home Medications Prior to Admission medications   Medication Sig Start Date End Date Taking? Authorizing Provider  amoxicillin-clavulanate (AUGMENTIN) 875-125 MG tablet Take 1 tablet by mouth every 12 (twelve) hours. 08/30/23  Yes Lilliam Chamblee L, PA  ibuprofen (ADVIL) 800 MG tablet Take 1 tablet (800 mg total) by mouth every 8 (eight) hours as needed. 08/30/23  Yes Netra Postlethwait L, PA  cyclobenzaprine (FLEXERIL) 10 MG tablet Take 1 tablet (10 mg total) by mouth 2 (two) times daily as needed for muscle spasms. 01/05/23   Tegeler, Canary Brim, MD  lidocaine (LIDODERM) 5 % Place 1 patch onto the skin daily. Remove & Discard patch within 12 hours or as directed by MD 01/05/23   Tegeler, Canary Brim, MD      Allergies    Patient has no known allergies.    Review of Systems   Review of Systems  Constitutional:  Positive for fever.  HENT:  Positive for dental problem.     Physical Exam Updated Vital Signs BP 121/74   Pulse 68   Temp 98.9 F (37.2 C) (Oral)   Resp 16   SpO2 99%  Physical Exam Vitals and nursing note reviewed.  Constitutional:      General: He is not in acute distress.    Appearance: He is well-developed.  HENT:     Head: Normocephalic and atraumatic.     Right Ear: Tympanic membrane normal.     Left Ear: Tympanic membrane normal.     Mouth/Throat:      Comments: Carie noted, no facial erythema, no appreciated swelling noted. No trismus. No evidence of fluctuance or erythema along gum line.  Eyes:     General:         Right eye: No discharge.        Left eye: No discharge.     Conjunctiva/sclera: Conjunctivae normal.  Pulmonary:     Effort: No respiratory distress.  Neurological:     Mental Status: He is alert.     Comments: Clear speech.   Psychiatric:        Behavior: Behavior normal.        Thought Content: Thought content normal.     ED Results / Procedures / Treatments   Labs (all labs ordered are listed, but only abnormal results are displayed) Labs Reviewed - No data to display  EKG None  Radiology No results found.  Procedures Procedures    Medications Ordered in ED Medications - No data to display  ED Course/ Medical Decision Making/ A&P                                 Medical Decision Making Patient is a 27 year old male, here for right facial pain, swelling this been going on for the last week.  He has evidence of a carie, in the right upper teeth.  I do not see any overt facial swelling, or  erythema.  And he does not have a fever on my exam.  He states he has had a fever at home, he is overall very well-appearing, I believe that his tooth is infected, and will likely need to be pulled.  Will put him on Augmentin, and send him home with ibuprofen.  I stated that the fever should improve within the next 24 to 48 hours, but if it is persistent, he has confusion, facial swelling, redness return to the ER.  We discussed the very importance, of him having this tooth removed, as this will be recurrent until he removes it.  Risk Prescription drug management.   Clinical Impression(s) / ED Diagnoses Final diagnoses:  Dental infection    Rx / DC Orders ED Discharge Orders          Ordered    amoxicillin-clavulanate (AUGMENTIN) 875-125 MG tablet  Every 12 hours        08/30/23 1614    ibuprofen (ADVIL) 800 MG tablet  Every 8 hours PRN        08/30/23 1614              Naiah Donahoe, [place], PA 08/30/23 1725    Derwood Kaplan, MD 08/30/23 1944

## 2023-08-30 NOTE — ED Triage Notes (Signed)
Pt here from home with c/o dental pain

## 2023-08-30 NOTE — Discharge Instructions (Addendum)
I believe that you have infection 2/2 to an infected tooth. You will need to take the antibiotics and completion and see a dentist as soon as possible. The tooth will need to be pulled so the infection does not keep reoccurring. Return if you have worsening facial swelling, fevers, chills, or facial redness.

## 2024-11-24 ENCOUNTER — Other Ambulatory Visit: Payer: Self-pay

## 2024-11-24 DIAGNOSIS — G8911 Acute pain due to trauma: Secondary | ICD-10-CM

## 2024-11-26 ENCOUNTER — Other Ambulatory Visit: Payer: Self-pay

## 2024-11-26 ENCOUNTER — Ambulatory Visit
Admission: RE | Admit: 2024-11-26 | Discharge: 2024-11-26 | Disposition: A | Discharge status: Home / Self Care | Source: Ambulatory Visit

## 2024-11-26 DIAGNOSIS — G8911 Acute pain due to trauma: Secondary | ICD-10-CM

## 2024-12-03 ENCOUNTER — Other Ambulatory Visit: Payer: Self-pay
